# Patient Record
Sex: Male | Born: 1990 | Hispanic: Yes | Marital: Married | Smoking: Never smoker
Health system: Southern US, Community
[De-identification: ages and names within clinical notes are randomized; demographics above are authoritative.]

---

## 2008-12-08 ENCOUNTER — Emergency Department (HOSPITAL_COMMUNITY): Admission: EM | Admit: 2008-12-08 | Discharge: 2008-12-08 | Payer: Self-pay | Admitting: Emergency Medicine

## 2010-06-29 ENCOUNTER — Emergency Department (HOSPITAL_COMMUNITY): Admission: EM | Admit: 2010-06-29 | Discharge: 2010-06-29 | Payer: Self-pay | Admitting: Emergency Medicine

## 2010-07-01 ENCOUNTER — Emergency Department (HOSPITAL_COMMUNITY): Admission: EM | Admit: 2010-07-01 | Discharge: 2010-07-01 | Payer: Self-pay | Admitting: Emergency Medicine

## 2013-01-09 ENCOUNTER — Emergency Department (HOSPITAL_COMMUNITY)
Admission: EM | Admit: 2013-01-09 | Discharge: 2013-01-09 | Disposition: A | Discharge status: Home / Self Care | Payer: Self-pay | Attending: Emergency Medicine | Admitting: Emergency Medicine

## 2013-01-09 ENCOUNTER — Encounter (HOSPITAL_COMMUNITY): Payer: Self-pay | Admitting: *Deleted

## 2013-01-09 DIAGNOSIS — L03032 Cellulitis of left toe: Secondary | ICD-10-CM

## 2013-01-09 DIAGNOSIS — L03039 Cellulitis of unspecified toe: Secondary | ICD-10-CM | POA: Insufficient documentation

## 2013-01-09 DIAGNOSIS — L600 Ingrowing nail: Secondary | ICD-10-CM | POA: Insufficient documentation

## 2013-01-09 MED ORDER — SULFAMETHOXAZOLE-TRIMETHOPRIM 800-160 MG PO TABS: 1.0000 | ORAL_TABLET | Freq: Two times a day (BID) | ORAL | Status: AC

## 2013-01-09 MED ORDER — HYDROCODONE-ACETAMINOPHEN 5-325 MG PO TABS: 1.0000 | ORAL_TABLET | ORAL | Status: DC | PRN

## 2013-01-09 NOTE — ED Notes (Signed)
Ingrown toenail lt great toe for 2 weeks.

## 2013-01-09 NOTE — ED Provider Notes (Signed)
History     CSN: [id number]  Arrival date & time 01/09/13  1843   First MD Initiated Contact with Patient 01/09/13 1951      No chief complaint on file.   (Consider location/radiation/quality/duration/timing/severity/associated sxs/prior treatment) HPI Matthew Wilson is a 22 y.o. male who presents to the ED with left great toe pain x 2 weeks. Started with an ingrown toe nail he has been soaking it and using peroxide and it has been draining some. The history was provided by the patient.  History reviewed. No pertinent past medical history.  History reviewed. No pertinent past surgical history.  History reviewed. No pertinent family history.  History  Substance Use Topics  . Smoking status: Never Smoker   . Smokeless tobacco: Not on file  . Alcohol Use: Yes     Comment: occ      Review of Systems  Constitutional: Negative for fever and chills.  HENT: Negative for neck pain.   Gastrointestinal: Negative for nausea and vomiting.  Musculoskeletal:       Pain and swelling with some drainage left great toe.  Skin: Positive for wound.  Neurological: Negative for headaches.  Psychiatric/Behavioral: The patient is not nervous/anxious.     Allergies  Review of patient's allergies indicates no known allergies.  Home Medications   Current Outpatient Rx  Name  Route  Sig  Dispense  Refill  . acetaminophen (TYLENOL) 500 MG tablet   Oral   Take 500 mg by mouth every 6 (six) hours as needed for pain.           BP 148/83  Pulse 72  Temp(Src) 98.7 F (37.1 C) (Oral)  Resp 18  Ht 5\' 5"  (1.651 m)  Wt 210 lb (95.255 kg)  BMI 34.95 kg/m2  SpO2 98%  Physical Exam  Nursing note and vitals reviewed. Constitutional: He is oriented to person, place, and time. He appears well-developed and well-nourished. No distress.  HENT:  Head: Normocephalic.  Eyes: EOM are normal.  Neck: Neck supple.  Cardiovascular: Normal rate.   Pulmonary/Chest: Effort normal.    Musculoskeletal:       Left foot: He exhibits tenderness and swelling. He exhibits normal range of motion.       Feet:  There is tenderness and deformity at the left great toe nail bed. There is erythema and a small amount of drainage.   Neurological: He is alert and oriented to person, place, and time. No cranial nerve deficit.  Skin: Skin is warm and dry.  Psychiatric: He has a normal mood and affect.    ED Course  Procedures (including critical care time)   MDM  22 y.o. male with infected ingrown toe nail. I have reviewed this patient's vital signs, nurses notes and discussed clinical findings and plan of care with the patient and he voices understanding. Will treat with antibiotics and pain management and will have him follow up with Dr. Charlsie Merles. He will return here as needed.   Medication List    TAKE these medications       HYDROcodone-acetaminophen 5-325 MG per tablet  Commonly known as:  NORCO/VICODIN  Take 1 tablet by mouth every 4 (four) hours as needed.     sulfamethoxazole-trimethoprim 800-160 MG per tablet  Commonly known as:  SEPTRA DS  Take 1 tablet by mouth every 12 (twelve) hours.      ASK your doctor about these medications       acetaminophen 500 MG tablet  Commonly known as:  TYLENOL  Take 500 mg by mouth every 6 (six) hours as needed for pain.               [place], [place] 01/09/13 2207

## 2013-01-14 NOTE — ED Provider Notes (Signed)
Medical screening examination/treatment/procedure(s) were performed by non-physician practitioner and as supervising physician I was immediately available for consultation/collaboration.  Alondra Sahni, MD 01/14/13 0742 

## 2014-08-06 ENCOUNTER — Encounter (HOSPITAL_COMMUNITY): Payer: Self-pay | Admitting: Emergency Medicine

## 2014-08-06 ENCOUNTER — Emergency Department (HOSPITAL_COMMUNITY)
Admission: EM | Admit: 2014-08-06 | Discharge: 2014-08-06 | Discharge status: Against Medical Advice | Payer: Self-pay | Attending: Emergency Medicine | Admitting: Emergency Medicine

## 2014-08-06 DIAGNOSIS — S01511A Laceration without foreign body of lip, initial encounter: Secondary | ICD-10-CM | POA: Insufficient documentation

## 2014-08-06 DIAGNOSIS — Y998 Other external cause status: Secondary | ICD-10-CM | POA: Insufficient documentation

## 2014-08-06 DIAGNOSIS — Y9389 Activity, other specified: Secondary | ICD-10-CM | POA: Insufficient documentation

## 2014-08-06 DIAGNOSIS — Y929 Unspecified place or not applicable: Secondary | ICD-10-CM | POA: Insufficient documentation

## 2014-08-06 DIAGNOSIS — Y288XXA Contact with other sharp object, undetermined intent, initial encounter: Secondary | ICD-10-CM | POA: Insufficient documentation
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
## 2014-08-06 NOTE — ED Notes (Signed)
Called patient to place in room, no answer. Patient's in ED waiting room stated "He left."
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
## 2017-02-09 ENCOUNTER — Emergency Department (HOSPITAL_COMMUNITY)
Admission: EM | Admit: 2017-02-09 | Discharge: 2017-02-09 | Disposition: A | Discharge status: Home / Self Care | Payer: Self-pay | Attending: Emergency Medicine | Admitting: Emergency Medicine

## 2017-02-09 ENCOUNTER — Emergency Department (HOSPITAL_COMMUNITY): Payer: Self-pay

## 2017-02-09 ENCOUNTER — Encounter (HOSPITAL_COMMUNITY): Payer: Self-pay | Admitting: *Deleted

## 2017-02-09 DIAGNOSIS — Y9289 Other specified places as the place of occurrence of the external cause: Secondary | ICD-10-CM | POA: Insufficient documentation

## 2017-02-09 DIAGNOSIS — Y998 Other external cause status: Secondary | ICD-10-CM | POA: Insufficient documentation

## 2017-02-09 DIAGNOSIS — S39012A Strain of muscle, fascia and tendon of lower back, initial encounter: Secondary | ICD-10-CM | POA: Insufficient documentation

## 2017-02-09 DIAGNOSIS — W228XXA Striking against or struck by other objects, initial encounter: Secondary | ICD-10-CM | POA: Insufficient documentation

## 2017-02-09 DIAGNOSIS — Y9319 Activity, other involving water and watercraft: Secondary | ICD-10-CM | POA: Insufficient documentation

## 2017-02-09 LAB — URINALYSIS, ROUTINE W REFLEX MICROSCOPIC
BILIRUBIN URINE: NEGATIVE
Bacteria, UA: NONE SEEN
GLUCOSE, UA: NEGATIVE mg/dL
KETONES UR: NEGATIVE mg/dL
LEUKOCYTES UA: NEGATIVE
NITRITE: NEGATIVE
PH: 6 (ref 5.0–8.0)
Protein, ur: NEGATIVE mg/dL
Specific Gravity, Urine: 1.005 (ref 1.005–1.030)
Squamous Epithelial / LPF: NONE SEEN

## 2017-02-09 MED ORDER — HYDROCODONE-ACETAMINOPHEN 5-325 MG PO TABS: ORAL_TABLET | ORAL | 0 refills | Status: AC

## 2017-02-09 MED ORDER — IBUPROFEN 800 MG PO TABS: 800.0000 mg | ORAL_TABLET | Freq: Three times a day (TID) | ORAL | 0 refills | Status: AC

## 2017-02-09 MED ORDER — CYCLOBENZAPRINE HCL 10 MG PO TABS: 10.0000 mg | ORAL_TABLET | Freq: Three times a day (TID) | ORAL | 0 refills | Status: AC | PRN

## 2017-02-09 MED ORDER — CYCLOBENZAPRINE HCL 10 MG PO TABS
10.0000 mg | ORAL_TABLET | Freq: Once | ORAL | Status: AC
  Administered 2017-02-09: 10 mg via ORAL
  Filled 2017-02-09: qty 1

## 2017-02-09 MED ORDER — IBUPROFEN 800 MG PO TABS
800.0000 mg | ORAL_TABLET | Freq: Once | ORAL | Status: AC
  Administered 2017-02-09: 800 mg via ORAL
  Filled 2017-02-09: qty 1

## 2017-02-09 NOTE — Discharge Instructions (Signed)
Apply ice packs on/off to your back.  Return here for any worsening symptoms
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
## 2018-02-15 IMAGING — DX DG LUMBAR SPINE COMPLETE 4+V
5 series · 5 of 5 positions shown · non-contrast
Comparison: 12/08/2008.

CLINICAL DATA: 25-year-old male with right lower back pain starting
yesterday post fall extending down the right leg. Initial encounter.

EXAM:
LUMBAR SPINE - COMPLETE 4+ VIEW

[l-spine ap]
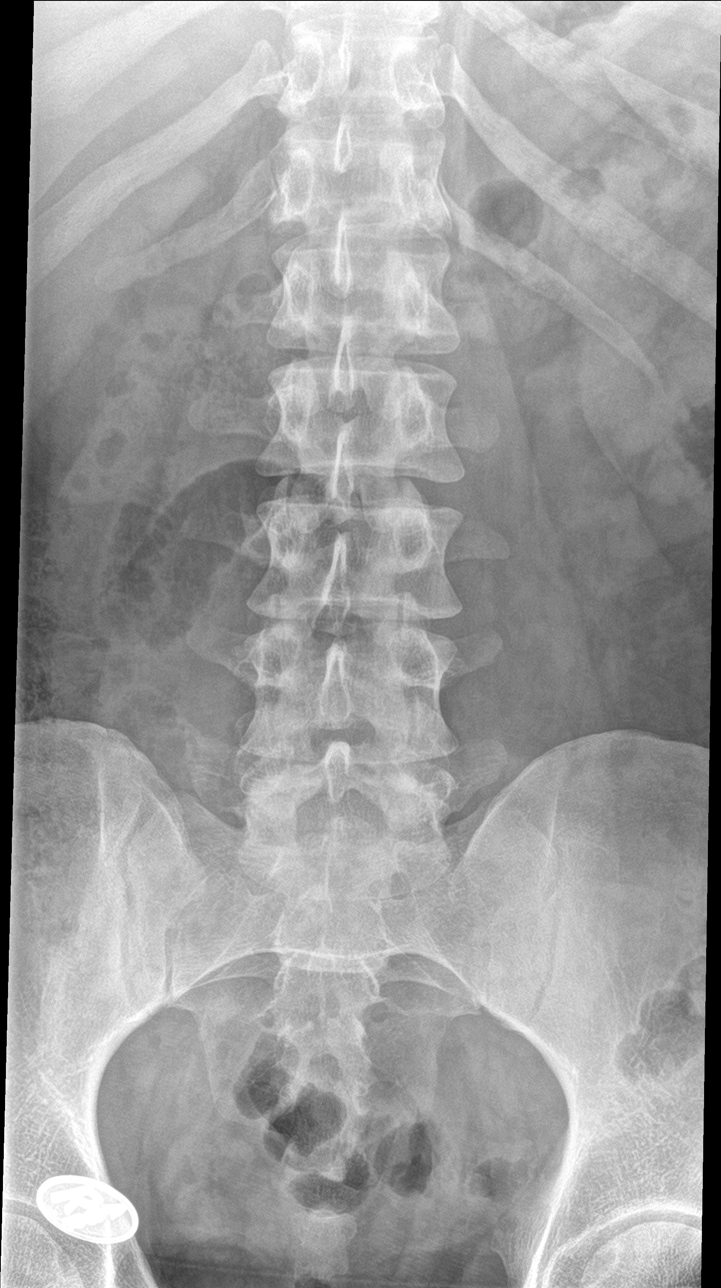

[l-spine obl (1 of 2)]
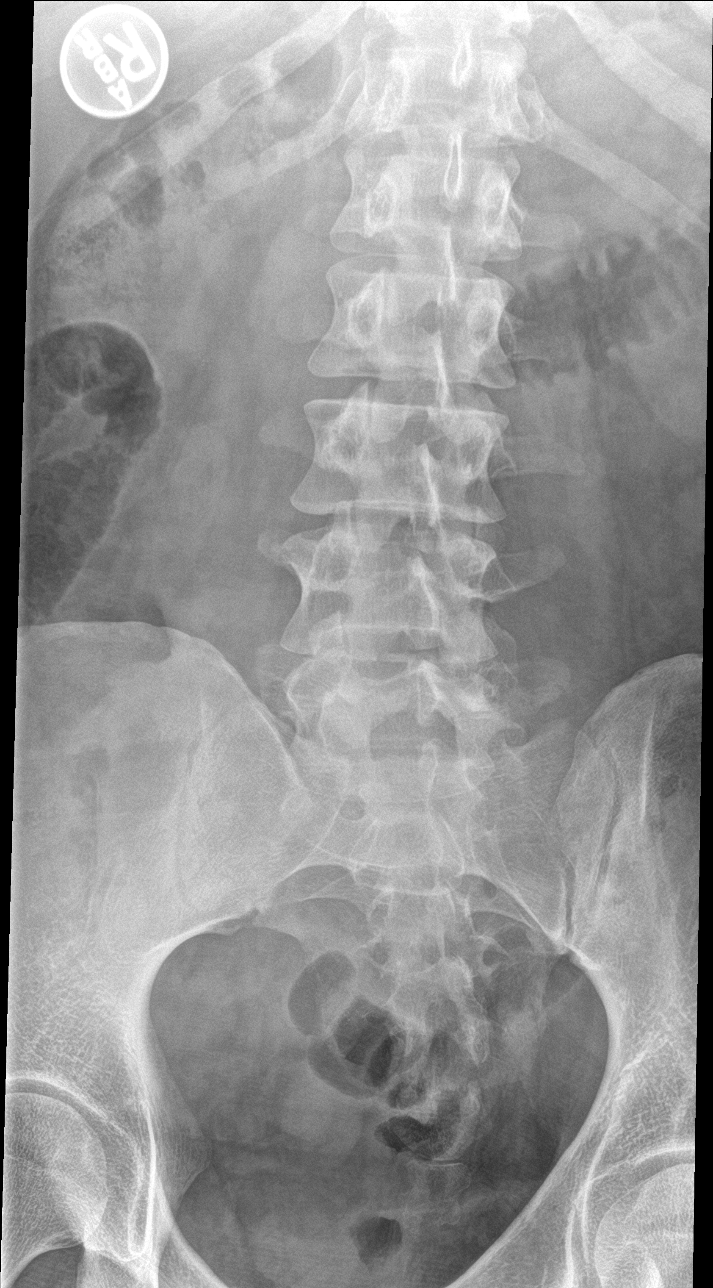

[l-spine obl (2 of 2)]
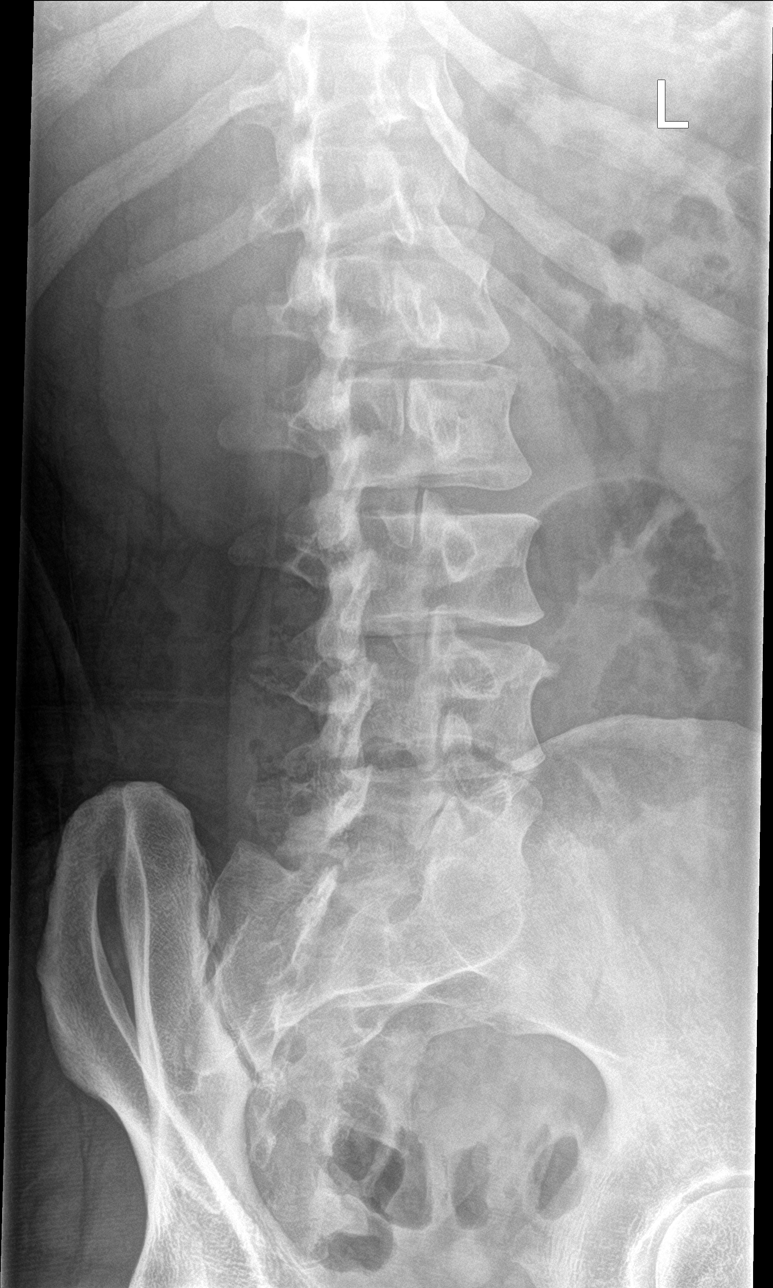

[l-spine lat]
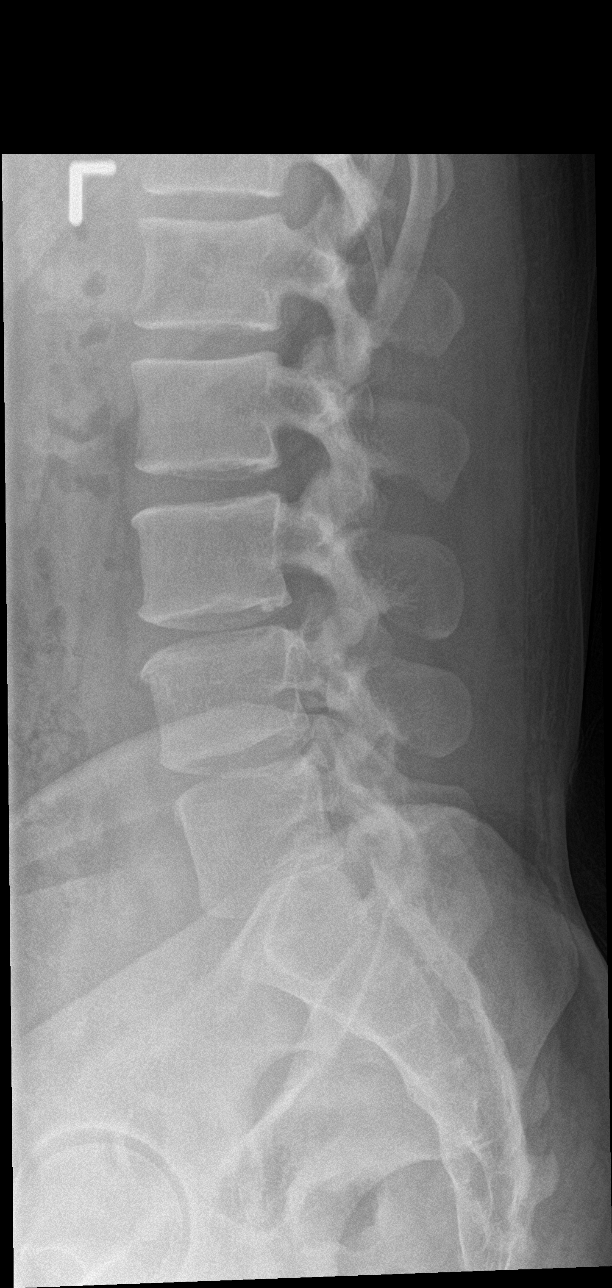

[l-spine spot]
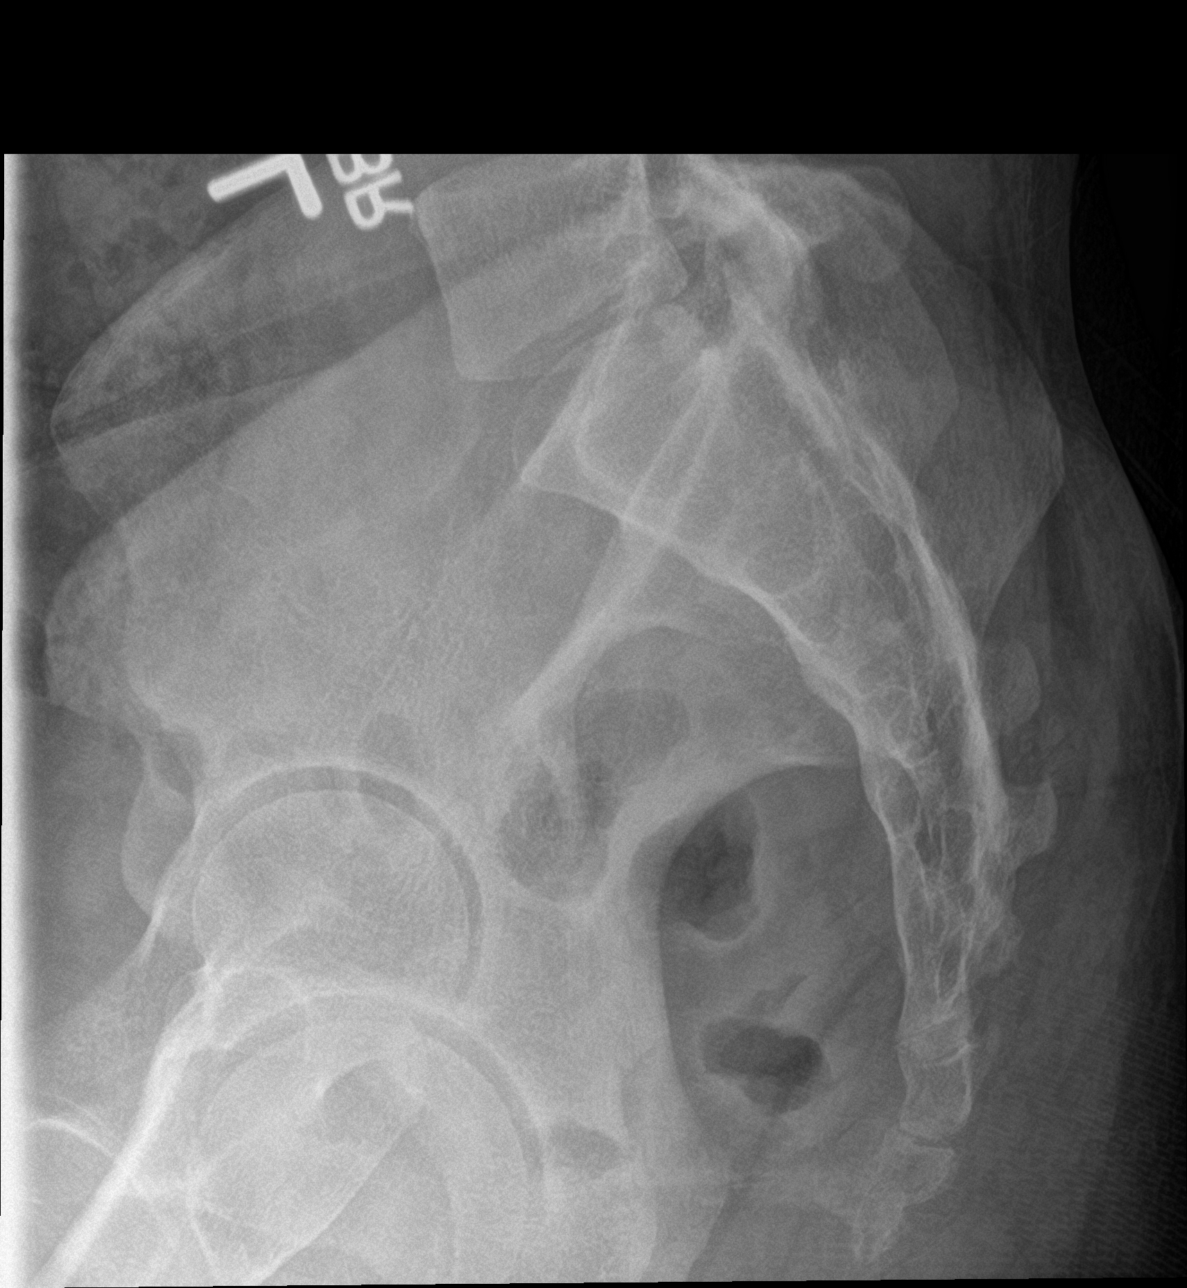

[5 of 5 positions shown; findings below may reference images not displayed]

FINDINGS: Straightening of the lumbar spine without fracture malalignment.

Minimal Schmorl's node deformity inferior endplate L3 and L4.
Minimal spur anterior margin L3, L4 and L5. Minimal L5-S1 disc space
narrowing.
IMPRESSION: Straightening of the lumbar spine without fracture or malalignment.

Minimal L5-S1 disc space narrowing.

## 2019-12-22 ENCOUNTER — Ambulatory Visit: Payer: HRSA Program | Attending: Internal Medicine

## 2019-12-22 DIAGNOSIS — U071 COVID-19: Secondary | ICD-10-CM | POA: Insufficient documentation

## 2019-12-22 DIAGNOSIS — Z20822 Contact with and (suspected) exposure to covid-19: Secondary | ICD-10-CM

## 2019-12-23 LAB — SARS-COV-2, NAA 2 DAY TAT

## 2019-12-23 LAB — NOVEL CORONAVIRUS, NAA: SARS-CoV-2, NAA: DETECTED — AB
